# Patient Record
Sex: Female | Born: 1990 | Race: Black or African American | Hispanic: No | Marital: Single | Smoking: Former smoker
Health system: Southern US, Community
[De-identification: ages and names within clinical notes are randomized; demographics above are authoritative.]

## PROBLEM LIST (undated history)

## (undated) DIAGNOSIS — G43909 Migraine, unspecified, not intractable, without status migrainosus: Secondary | ICD-10-CM

## (undated) DIAGNOSIS — E119 Type 2 diabetes mellitus without complications: Secondary | ICD-10-CM

## (undated) DIAGNOSIS — J45909 Unspecified asthma, uncomplicated: Secondary | ICD-10-CM

## (undated) DIAGNOSIS — E282 Polycystic ovarian syndrome: Secondary | ICD-10-CM

## (undated) DIAGNOSIS — F909 Attention-deficit hyperactivity disorder, unspecified type: Secondary | ICD-10-CM

## (undated) HISTORY — PX: ANKLE SURGERY: SHX546

---

## 2021-05-15 ENCOUNTER — Emergency Department (HOSPITAL_BASED_OUTPATIENT_CLINIC_OR_DEPARTMENT_OTHER)
Admission: EM | Admit: 2021-05-15 | Discharge: 2021-05-16 | Disposition: A | Discharge status: Home / Self Care | Payer: Self-pay | Attending: Emergency Medicine | Admitting: Emergency Medicine

## 2021-05-15 ENCOUNTER — Other Ambulatory Visit: Payer: Self-pay

## 2021-05-15 DIAGNOSIS — J069 Acute upper respiratory infection, unspecified: Secondary | ICD-10-CM | POA: Insufficient documentation

## 2021-05-15 DIAGNOSIS — R079 Chest pain, unspecified: Secondary | ICD-10-CM | POA: Insufficient documentation

## 2021-05-15 DIAGNOSIS — R42 Dizziness and giddiness: Secondary | ICD-10-CM | POA: Insufficient documentation

## 2021-05-15 DIAGNOSIS — R112 Nausea with vomiting, unspecified: Secondary | ICD-10-CM | POA: Insufficient documentation

## 2021-05-15 HISTORY — DX: Polycystic ovarian syndrome: E28.2

## 2021-05-15 HISTORY — DX: Unspecified asthma, uncomplicated: J45.909

## 2021-05-15 HISTORY — DX: Type 2 diabetes mellitus without complications: E11.9

## 2021-05-15 HISTORY — DX: Attention-deficit hyperactivity disorder, unspecified type: F90.9

## 2021-05-16 ENCOUNTER — Emergency Department (HOSPITAL_BASED_OUTPATIENT_CLINIC_OR_DEPARTMENT_OTHER): Payer: Self-pay

## 2021-05-16 ENCOUNTER — Encounter (HOSPITAL_BASED_OUTPATIENT_CLINIC_OR_DEPARTMENT_OTHER): Payer: Self-pay | Admitting: Emergency Medicine

## 2021-05-16 LAB — CBC WITH DIFFERENTIAL/PLATELET
Abs Immature Granulocytes: 0.04 10*3/uL (ref 0.00–0.07)
Basophils Absolute: 0 10*3/uL (ref 0.0–0.1)
Basophils Relative: 0 %
Eosinophils Absolute: 0.1 10*3/uL (ref 0.0–0.5)
Eosinophils Relative: 1 %
HCT: 37.9 % (ref 36.0–46.0)
Hemoglobin: 12.8 g/dL (ref 12.0–15.0)
Immature Granulocytes: 1 %
Lymphocytes Relative: 13 %
Lymphs Abs: 0.8 10*3/uL (ref 0.7–4.0)
MCH: 28.9 pg (ref 26.0–34.0)
MCHC: 33.8 g/dL (ref 30.0–36.0)
MCV: 85.6 fL (ref 80.0–100.0)
Monocytes Absolute: 0.5 10*3/uL (ref 0.1–1.0)
Monocytes Relative: 8 %
Neutro Abs: 5.2 10*3/uL (ref 1.7–7.7)
Neutrophils Relative %: 77 %
Platelets: 289 10*3/uL (ref 150–400)
RBC: 4.43 MIL/uL (ref 3.87–5.11)
RDW: 14.6 % (ref 11.5–15.5)
WBC: 6.7 10*3/uL (ref 4.0–10.5)
nRBC: 0 % (ref 0.0–0.2)

## 2021-05-16 LAB — CBG MONITORING, ED: Glucose-Capillary: 107 mg/dL — ABNORMAL HIGH (ref 70–99)

## 2021-05-16 LAB — COMPREHENSIVE METABOLIC PANEL
ALT: 15 U/L (ref 0–44)
AST: 19 U/L (ref 15–41)
Albumin: 3.1 g/dL — ABNORMAL LOW (ref 3.5–5.0)
Alkaline Phosphatase: 37 U/L — ABNORMAL LOW (ref 38–126)
Anion gap: 8 (ref 5–15)
BUN: 12 mg/dL (ref 6–20)
CO2: 22 mmol/L (ref 22–32)
Calcium: 8.4 mg/dL — ABNORMAL LOW (ref 8.9–10.3)
Chloride: 103 mmol/L (ref 98–111)
Creatinine, Ser: 0.71 mg/dL (ref 0.44–1.00)
GFR, Estimated: >60 mL/min (ref >60)
Glucose, Bld: 98 mg/dL (ref 70–99)
Potassium: 3.5 mmol/L (ref 3.5–5.1)
Sodium: 133 mmol/L — ABNORMAL LOW (ref 135–145)
Total Bilirubin: 0.9 mg/dL (ref 0.3–1.2)
Total Protein: 7 g/dL (ref 6.5–8.1)

## 2021-05-16 LAB — HCG, SERUM, QUALITATIVE: Preg, Serum: NEGATIVE

## 2021-05-16 LAB — LIPASE, BLOOD: Lipase: 21 U/L (ref 11–51)

## 2021-05-16 MED ORDER — ACETAMINOPHEN 500 MG PO TABS
1000.0000 mg | ORAL_TABLET | Freq: Once | ORAL | Status: AC
  Administered 2021-05-16: 1000 mg via ORAL
  Filled 2021-05-16: qty 2

## 2021-05-16 MED ORDER — ONDANSETRON HCL 4 MG/2ML IJ SOLN
4.0000 mg | Freq: Once | INTRAMUSCULAR | Status: AC
  Administered 2021-05-16: 4 mg via INTRAVENOUS
  Filled 2021-05-16: qty 2

## 2021-05-16 MED ORDER — SODIUM CHLORIDE 0.9 % IV BOLUS
1000.0000 mL | Freq: Once | INTRAVENOUS | Status: AC
  Administered 2021-05-16: 1000 mL via INTRAVENOUS

## 2021-05-16 MED ORDER — ONDANSETRON 4 MG PO TBDP
4.0000 mg | ORAL_TABLET | Freq: Once | ORAL | Status: AC
  Administered 2021-05-16: 4 mg via ORAL
  Filled 2021-05-16: qty 1

## 2021-05-16 MED ORDER — BENZONATATE 100 MG PO CAPS: 100.0000 mg | ORAL_CAPSULE | Freq: Three times a day (TID) | ORAL | 0 refills | Status: AC

## 2021-05-16 MED ORDER — ONDANSETRON 4 MG PO TBDP
ORAL_TABLET | ORAL | 0 refills | Status: AC
Start: 2021-05-16 — End: ?

## 2021-05-16 NOTE — Discharge Instructions (Addendum)
Take tylenol 2 pills 4 times a day and motrin 4 pills 3 times a day.  Drink plenty of fluids.  Return for worsening shortness of breath, headache, confusion. Follow up with your family doctor.   

## 2021-05-16 NOTE — ED Triage Notes (Addendum)
Pt c/o body aches, chest pain, chills, chest pain, dizziness and nausea with 1 episode of vomiting. Pt states blood sugar was low at home and had protein shake and fruit but vomited after eating ?

## 2021-05-16 NOTE — ED Provider Notes (Signed)
?[place] EMERGENCY DEPARTMENT ?Provider Note ? ? ?CSN: [id number] ?Arrival date & time: 05/15/21  2358 ? ?  ? ?History ? ?Chief Complaint  ?Patient presents with  ? Chest Pain  ? ? ?Kristin Oneill is a 32 y.o. female. ? ?31 yo F with a chief complaints of fevers chills myalgias nausea vomiting feeling lightheaded cough and chest pain.  Been going on for about 48 hours.  She recently would been diagnosed with sinusitis about a week ago she felt she had gotten better and then got worse.  Her daughter is also been sick. ? ? ?Chest Pain ? ?  ? ?Home Medications ?Prior to Admission medications   ?Medication Sig Start Date End Date Taking? Authorizing Provider  ?benzonatate (TESSALON) 100 MG capsule Take 1 capsule (100 mg total) by mouth every 8 (eight) hours. 05/16/21  Yes Deno Etienne, DO  ?levocetirizine (XYZAL) 5 MG tablet Take 1 tablet by mouth every evening. 09/30/20  Yes [provider]  ?norethindrone-ethinyl estradiol-FE (LOESTRIN FE) 1-20 MG-MCG tablet Take 1 tablet by mouth daily. 09/30/20  Yes [provider]  ?ondansetron (ZOFRAN) 4 MG tablet TAKE 1 TABLET BY MOUTH EVERY 8 HOURS AS NEEDED FOR NAUSEA AND VOMITING FOR UP TO 18 DAYS 04/22/21  Yes [provider]  ?ondansetron (ZOFRAN-ODT) 4 MG disintegrating tablet 4mg  ODT q4 hours prn nausea/vomit 05/16/21  Yes Deno Etienne, DO  ?phentermine (ADIPEX-P) 37.5 MG tablet Take by mouth. 05/07/21 06/09/21 Yes [provider]  ?   ? ?Allergies    ?Abilify [aripiprazole] and Iodinated contrast media   ? ?Review of Systems   ?Review of Systems  ?Cardiovascular:  Positive for chest pain.  ? ?Physical Exam ?Updated Vital Signs ?BP (!) 147/93   Pulse (!) 122   Temp 98.4 ?F (36.9 ?C) (Oral)   Resp 18   Ht 5\' 8"  (1.727 m)   Wt 117.9 kg   SpO2 96%   BMI 39.53 kg/m?  ?Physical Exam ?Vitals and nursing note reviewed.  ?Constitutional:   ?   General: She is not in acute distress. ?   Appearance: She is well-developed. She is not  diaphoretic.  ?HENT:  ?   Head: Normocephalic and atraumatic.  ?Eyes:  ?   Pupils: Pupils are equal, round, and reactive to light.  ?Cardiovascular:  ?   Rate and Rhythm: Normal rate and regular rhythm.  ?   Heart sounds: No murmur heard. ?  No friction rub. No gallop.  ?Pulmonary:  ?   Effort: Pulmonary effort is normal.  ?   Breath sounds: No wheezing or rales.  ?Abdominal:  ?   General: There is abdominal bruit. There is no distension.  ?   Palpations: Abdomen is soft.  ?   Tenderness: There is no abdominal tenderness.  ?   Comments: Mild epigastric pain ?  ?Musculoskeletal:     ?   General: No tenderness.  ?   Cervical back: Normal range of motion and neck supple.  ?Skin: ?   General: Skin is warm and dry.  ?Neurological:  ?   Mental Status: She is alert and oriented to person, place, and time.  ?Psychiatric:     ?   Behavior: Behavior normal.  ? ? ?ED Results / Procedures / Treatments   ?Labs ?(all labs ordered are listed, but only abnormal results are displayed) ?Labs Reviewed  ?COMPREHENSIVE METABOLIC PANEL - Abnormal; Notable for the following components:  ?    Result Value  ? Sodium 133 (*)   ?  Calcium 8.4 (*)   ? Albumin 3.1 (*)   ? Alkaline Phosphatase 37 (*)   ? All other components within normal limits  ?CBG MONITORING, ED - Abnormal; Notable for the following components:  ? Glucose-Capillary 107 (*)   ? All other components within normal limits  ?LIPASE, BLOOD  ?HCG, SERUM, QUALITATIVE  ?CBC WITH DIFFERENTIAL/PLATELET  ?CBC WITH DIFFERENTIAL/PLATELET  ? ? ?EKG ?EKG Interpretation ? ?Date/Time:  Friday May 16 2021 00:17:15 EDT ?Ventricular Rate:  111 ?PR Interval:  143 ?QRS Duration: 87 ?QT Interval:  304 ?QTC Calculation: 413 ?R Axis:   73 ?Text Interpretation: Sinus tachycardia No old tracing to compare Confirmed by Deno Etienne [phone]) on 05/16/2021 12:37:34 AM ? ?Radiology ?DG Chest Port 1 View ? ?Result Date: 05/16/2021 ?CLINICAL DATA:  Cough and chest pain EXAM: PORTABLE CHEST 1 VIEW COMPARISON:   None. FINDINGS: The heart size and mediastinal contours are within normal limits. Both lungs are clear. The visualized skeletal structures are unremarkable. IMPRESSION: No active disease. Electronically Signed   By: Donavan Foil M.D.   On: 05/16/2021 00:37   ? ?Procedures ?Procedures  ? ? ?Medications Ordered in ED ?Medications  ?sodium chloride 0.9 % bolus 1,000 mL (1,000 mLs Intravenous New Bag/Given 05/16/21 0056)  ?ondansetron [facility]) injection 4 mg (4 mg Intravenous Given 05/16/21 0056)  ?acetaminophen (TYLENOL) tablet 1,000 mg (1,000 mg Oral Given 05/16/21 0056)  ? ? ?ED Course/ Medical Decision Making/ A&P ?  ?                        ?Medical Decision Making ?Amount and/or Complexity of Data Reviewed ?Labs: ordered. ?Radiology: ordered. ? ?Risk ?OTC drugs. ?Prescription drug management. ? ? ?31 yo F with a chief complaint of what sounds like a viral syndrome cough congestion nausea vomiting going on since yesterday.  Her daughter had had a similar illness.  She is a bit tachycardic here.  Reportedly was hypoglycemic at home.  We will give a bolus of IV fluids check blood work treat nausea.  Reassess. ? ?Patient's tachycardia has resolved.  No significant electrolyte abnormality no significant anemia.  Chest x-ray independently interpreted by me without focal infiltrate.  Will treat as a viral syndrome.  PCP follow-up. ? ?1:43 AM:  I have discussed the diagnosis/risks/treatment options with the patient.  Evaluation and diagnostic testing in the emergency department does not suggest an emergent condition requiring admission or immediate intervention beyond what has been performed at this time.  They will follow up with  PCP. We also discussed returning to the ED immediately if new or worsening sx occur. We discussed the sx which are most concerning (e.g., sudden worsening pain, fever, inability to tolerate by mouth) that necessitate immediate return. Medications administered to the patient during their visit and  any new prescriptions provided to the patient are listed below. ? ?Medications given during this visit ?Medications  ?sodium chloride 0.9 % bolus 1,000 mL (1,000 mLs Intravenous New Bag/Given 05/16/21 0056)  ?ondansetron [facility]) injection 4 mg (4 mg Intravenous Given 05/16/21 0056)  ?acetaminophen (TYLENOL) tablet 1,000 mg (1,000 mg Oral Given 05/16/21 0056)  ? ? ? ?The patient appears reasonably screen and/or stabilized for discharge and I doubt any other medical condition or other [facility] requiring further screening, evaluation, or treatment in the ED at this time prior to discharge.  ? ? ? ? ? ? ? ? ?Final Clinical Impression(s) / ED Diagnoses ?Final diagnoses:  ?Viral URI with cough  ? ? ?  Rx / DC Orders ?ED Discharge Orders   ? ?      Ordered  ?  ondansetron (ZOFRAN-ODT) 4 MG disintegrating tablet       ? 05/16/21 0137  ?  benzonatate (TESSALON) 100 MG capsule  Every 8 hours       ? 05/16/21 0137  ? ?  ?  ? ?  ? ? ?  ?Deno Etienne, DO ?05/16/21 0143 ? ?

## 2021-09-30 ENCOUNTER — Emergency Department (HOSPITAL_BASED_OUTPATIENT_CLINIC_OR_DEPARTMENT_OTHER)
Admission: EM | Admit: 2021-09-30 | Discharge: 2021-09-30 | Disposition: A | Discharge status: Home / Self Care | Payer: Medicaid Other | Attending: Emergency Medicine | Admitting: Emergency Medicine

## 2021-09-30 ENCOUNTER — Other Ambulatory Visit: Payer: Self-pay

## 2021-09-30 ENCOUNTER — Encounter (HOSPITAL_BASED_OUTPATIENT_CLINIC_OR_DEPARTMENT_OTHER): Payer: Self-pay

## 2021-09-30 ENCOUNTER — Emergency Department (HOSPITAL_BASED_OUTPATIENT_CLINIC_OR_DEPARTMENT_OTHER): Payer: Medicaid Other

## 2021-09-30 DIAGNOSIS — W01198A Fall on same level from slipping, tripping and stumbling with subsequent striking against other object, initial encounter: Secondary | ICD-10-CM | POA: Insufficient documentation

## 2021-09-30 DIAGNOSIS — J45909 Unspecified asthma, uncomplicated: Secondary | ICD-10-CM | POA: Insufficient documentation

## 2021-09-30 DIAGNOSIS — E119 Type 2 diabetes mellitus without complications: Secondary | ICD-10-CM | POA: Insufficient documentation

## 2021-09-30 DIAGNOSIS — F17210 Nicotine dependence, cigarettes, uncomplicated: Secondary | ICD-10-CM | POA: Insufficient documentation

## 2021-09-30 DIAGNOSIS — S0990XA Unspecified injury of head, initial encounter: Secondary | ICD-10-CM

## 2021-09-30 DIAGNOSIS — S0083XA Contusion of other part of head, initial encounter: Secondary | ICD-10-CM | POA: Insufficient documentation

## 2021-09-30 HISTORY — DX: Migraine, unspecified, not intractable, without status migrainosus: G43.909
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
## 2021-09-30 NOTE — ED Triage Notes (Signed)
Pt reports at around 12pm today she was in a closet and a book came down and hit her in the middle forehead. Pt states she laid down after taking migraine meds and took a nap; noted ringing in her ears as well. Pt states she woke up and now feels weird; endorses dizziness and feels off-balance. Pt states her vision goes in circles.

## 2021-12-15 ENCOUNTER — Emergency Department (HOSPITAL_BASED_OUTPATIENT_CLINIC_OR_DEPARTMENT_OTHER)
Admission: EM | Admit: 2021-12-15 | Discharge: 2021-12-15 | Disposition: A | Discharge status: Home / Self Care | Payer: BC Managed Care – PPO | Attending: Emergency Medicine | Admitting: Emergency Medicine

## 2021-12-15 ENCOUNTER — Encounter (HOSPITAL_BASED_OUTPATIENT_CLINIC_OR_DEPARTMENT_OTHER): Payer: Self-pay

## 2021-12-15 ENCOUNTER — Other Ambulatory Visit: Payer: Self-pay

## 2021-12-15 ENCOUNTER — Other Ambulatory Visit (HOSPITAL_BASED_OUTPATIENT_CLINIC_OR_DEPARTMENT_OTHER): Payer: Self-pay

## 2021-12-15 DIAGNOSIS — I10 Essential (primary) hypertension: Secondary | ICD-10-CM | POA: Insufficient documentation

## 2021-12-15 DIAGNOSIS — E119 Type 2 diabetes mellitus without complications: Secondary | ICD-10-CM | POA: Diagnosis not present

## 2021-12-15 DIAGNOSIS — N3000 Acute cystitis without hematuria: Secondary | ICD-10-CM | POA: Diagnosis not present

## 2021-12-15 DIAGNOSIS — B3731 Acute candidiasis of vulva and vagina: Secondary | ICD-10-CM | POA: Diagnosis not present

## 2021-12-15 DIAGNOSIS — N898 Other specified noninflammatory disorders of vagina: Secondary | ICD-10-CM

## 2021-12-15 DIAGNOSIS — R300 Dysuria: Secondary | ICD-10-CM | POA: Diagnosis present

## 2021-12-15 LAB — URINALYSIS, ROUTINE W REFLEX MICROSCOPIC
Bilirubin Urine: NEGATIVE
Glucose, UA: NEGATIVE mg/dL
Hgb urine dipstick: NEGATIVE
Ketones, ur: NEGATIVE mg/dL
Nitrite: NEGATIVE
Protein, ur: NEGATIVE mg/dL
Specific Gravity, Urine: 1.024 (ref 1.005–1.030)
WBC, UA: >50 WBC/hpf — ABNORMAL HIGH (ref 0–5)
pH: 6.5 (ref 5.0–8.0)

## 2021-12-15 LAB — WET PREP, GENITAL
Clue Cells Wet Prep HPF POC: NONE SEEN
Sperm: NONE SEEN
Trich, Wet Prep: NONE SEEN
WBC, Wet Prep HPF POC: <10 (ref <10)

## 2021-12-15 LAB — PREGNANCY, URINE: Preg Test, Ur: NEGATIVE

## 2021-12-15 MED ORDER — FLUCONAZOLE 150 MG PO TABS
150.0000 mg | ORAL_TABLET | Freq: Once | ORAL | Status: AC
  Administered 2021-12-15: 150 mg via ORAL
  Filled 2021-12-15: qty 1

## 2021-12-15 MED ORDER — CEPHALEXIN 500 MG PO CAPS
500.0000 mg | ORAL_CAPSULE | Freq: Four times a day (QID) | ORAL | 0 refills | Status: AC
  Filled 2021-12-15: qty 28, 7d supply, fill #0

## 2021-12-15 NOTE — Discharge Instructions (Signed)
Diflucan given to you here for the vaginal yeast infection.  Would recommend avoiding intercourse until feeling better.  If symptoms persist follow-up with your doctor.  Urinalysis questionable urinary tract infection.  We will go and treat you as possible urinary tract infection take the antibiotic Keflex as directed for the next 7 days.  Cultures for STD were pending will be notified if they are positive.

## 2021-12-15 NOTE — ED Triage Notes (Signed)
Pt c/o dysuria/ vaginal pain onset this AM. States she had sex w fiance last night, unsure if pain began then, but states she feels "tender" this morning.

## 2021-12-15 NOTE — ED Notes (Signed)
Discharge instructions, follow up care, and prescription reviewed and explained, pt verbalized understanding. Pt caox4 and ambulatory on d/c.  

## 2021-12-15 NOTE — ED Provider Notes (Addendum)
[facility] EMERGENCY DEPT Provider Note   CSN: [id number] Arrival date & time: 12/15/21  3335     History  Chief Complaint  Patient presents with   Dysuria    Kristin Oneill is a 31 y.o. female.  Patient with a complaint of vaginal pain and burning with urination.  Patient is unable to remember intercourse last night.  But definitely tenderness morning.  Not interested in pressing any charges.  Past medical history significant for diabetes without complications polycystic ovarian syndrome asthma ADHD migraines.  Occasional cigarette smoker.  Patient denies any vaginal bleeding.       Home Medications Prior to Admission medications   Medication Sig Start Date End Date Taking? Authorizing Provider  benzonatate (TESSALON) 100 MG capsule Take 1 capsule (100 mg total) by mouth every 8 (eight) hours. 05/16/21   Melene Plan, DO  levocetirizine (XYZAL) 5 MG tablet Take 1 tablet by mouth every evening. 09/30/20   [provider]  norethindrone-ethinyl estradiol-FE (LOESTRIN FE) 1-20 MG-MCG tablet Take 1 tablet by mouth daily. 09/30/20   [provider]  ondansetron (ZOFRAN-ODT) 4 MG disintegrating tablet 4mg  ODT q4 hours prn nausea/vomit 05/16/21   05/18/21, DO  phentermine (ADIPEX-P) 37.5 MG tablet Take by mouth. 05/07/21 06/09/21  [provider]      Allergies    Abilify [aripiprazole] and Iodinated contrast media    Review of Systems   Review of Systems  Constitutional:  Negative for chills and fever.  HENT:  Negative for ear pain and sore throat.   Eyes:  Negative for pain and visual disturbance.  Respiratory:  Negative for cough and shortness of breath.   Cardiovascular:  Negative for chest pain and palpitations.  Gastrointestinal:  Negative for abdominal pain and vomiting.  Genitourinary:  Positive for dysuria, pelvic pain and vaginal pain. Negative for hematuria, vaginal bleeding and vaginal discharge.  Musculoskeletal:  Negative for  arthralgias and back pain.  Skin:  Negative for color change and rash.  Neurological:  Negative for seizures and syncope.  All other systems reviewed and are negative.   Physical Exam Updated Vital Signs BP (!) 139/90   Pulse 82   Temp 97.9 F (36.6 C) (Oral)   Resp 18   Ht 1.727 m (5\' 8" )   Wt 113.4 kg   SpO2 100%   BMI 38.01 kg/m  Physical Exam Vitals and nursing note reviewed.  Constitutional:      General: She is not in acute distress.    Appearance: Normal appearance. She is well-developed. She is not ill-appearing.  HENT:     Head: Normocephalic and atraumatic.  Eyes:     Conjunctiva/sclera: Conjunctivae normal.  Cardiovascular:     Rate and Rhythm: Normal rate and regular rhythm.     Heart sounds: No murmur heard. Pulmonary:     Effort: Pulmonary effort is normal. No respiratory distress.     Breath sounds: Normal breath sounds.  Abdominal:     Palpations: Abdomen is soft.     Tenderness: There is no abdominal tenderness.  Genitourinary:    Comments: External genitalia normal.  Will bit of an abrasion at the posterior aspect of the vaginal opening.  A little bit of vaginal wall irritation but no bleeding no lacerations no cervical motion tenderness no uterine tenderness no adnexal tenderness.  Slight white discharge.  Generalized tenderness around the vaginal introitus.  No skin lesions.  No vesicles. Musculoskeletal:        General: No swelling.  Cervical back: Neck supple.  Skin:    General: Skin is warm and dry.     Capillary Refill: Capillary refill takes less than 2 seconds.  Neurological:     Mental Status: She is alert.  Psychiatric:        Mood and Affect: Mood normal.     ED Results / Procedures / Treatments   Labs (all labs ordered are listed, but only abnormal results are displayed) Labs Reviewed  WET PREP, GENITAL - Abnormal; Notable for the following components:      Result Value   Yeast Wet Prep HPF POC PRESENT (*)    All other  components within normal limits  URINALYSIS, ROUTINE W REFLEX MICROSCOPIC - Abnormal; Notable for the following components:   Leukocytes,Ua LARGE (*)    WBC, UA >50 (*)    Bacteria, UA RARE (*)    All other components within normal limits  URINE CULTURE  PREGNANCY, URINE  GC/CHLAMYDIA PROBE AMP ([place]) NOT AT [facility]    EKG None  Radiology No results found.  Procedures Procedures    Medications Ordered in ED Medications - No data to display  ED Course/ Medical Decision Making/ A&P                           Medical Decision Making Amount and/or Complexity of Data Reviewed Labs: ordered.  Pregnancy test test is negative.  Urinalysis with some RBCs 11-20 white blood cell count greater than 50.  Patient is worried about some injury.  Will go ahead and do pelvic.  Will get wet prep since there is a large amount of white blood cells.  And will get chlamydia and gonorrhea checked.  Patient not worried about an STD.  If pelvic leaves still some questions that hand may get pelvic ultrasound.  Pelvic exam seems to social general vaginal introitus irritation.  Little bit of an abrasion but no open wounds.  Wet prep positive for yeast.  Urinalysis questionable urinary tract infection urine culture sent.  Will treat here with Diflucan.  And put her on a course of Keflex.  Pregnancy test was negative.    Final Clinical Impression(s) / ED Diagnoses Final diagnoses:  Vaginal irritation  Acute cystitis without hematuria  Vaginal yeast infection    Rx / DC Orders ED Discharge Orders     None         Vanetta Mulders, MD 12/15/21 1032    Vanetta Mulders, MD 12/15/21 1156

## 2021-12-16 LAB — URINE CULTURE

## 2021-12-16 LAB — GC/CHLAMYDIA PROBE AMP (~~LOC~~) NOT AT ARMC
Chlamydia: NEGATIVE
Comment: NEGATIVE
Comment: NORMAL
Neisseria Gonorrhea: NEGATIVE

## 2022-01-14 ENCOUNTER — Emergency Department (HOSPITAL_BASED_OUTPATIENT_CLINIC_OR_DEPARTMENT_OTHER)
Admission: EM | Admit: 2022-01-14 | Discharge: 2022-01-14 | Disposition: A | Discharge status: Home / Self Care | Payer: BC Managed Care – PPO | Attending: Emergency Medicine | Admitting: Emergency Medicine

## 2022-01-14 ENCOUNTER — Encounter (HOSPITAL_BASED_OUTPATIENT_CLINIC_OR_DEPARTMENT_OTHER): Payer: Self-pay

## 2022-01-14 ENCOUNTER — Emergency Department (HOSPITAL_BASED_OUTPATIENT_CLINIC_OR_DEPARTMENT_OTHER): Payer: BC Managed Care – PPO | Admitting: Radiology

## 2022-01-14 ENCOUNTER — Other Ambulatory Visit: Payer: Self-pay

## 2022-01-14 DIAGNOSIS — E119 Type 2 diabetes mellitus without complications: Secondary | ICD-10-CM | POA: Insufficient documentation

## 2022-01-14 DIAGNOSIS — M542 Cervicalgia: Secondary | ICD-10-CM | POA: Insufficient documentation

## 2022-01-14 DIAGNOSIS — J45909 Unspecified asthma, uncomplicated: Secondary | ICD-10-CM | POA: Diagnosis not present

## 2022-01-14 DIAGNOSIS — M545 Low back pain, unspecified: Secondary | ICD-10-CM | POA: Diagnosis not present

## 2022-01-14 LAB — PREGNANCY, URINE: Preg Test, Ur: NEGATIVE

## 2022-01-14 MED ORDER — NAPROXEN 500 MG PO TABS
500.0000 mg | ORAL_TABLET | Freq: Two times a day (BID) | ORAL | 0 refills | Status: AC
  Filled 2022-01-14: qty 30, 15d supply, fill #0

## 2022-01-14 MED ORDER — CYCLOBENZAPRINE HCL 10 MG PO TABS
10.0000 mg | ORAL_TABLET | Freq: Two times a day (BID) | ORAL | 0 refills | Status: AC | PRN
  Filled 2022-01-14: qty 20, 10d supply, fill #0

## 2022-01-14 MED ORDER — IBUPROFEN 800 MG PO TABS
800.0000 mg | ORAL_TABLET | Freq: Once | ORAL | Status: AC
  Administered 2022-01-14: 800 mg via ORAL
  Filled 2022-01-14: qty 1

## 2022-01-14 NOTE — Discharge Instructions (Addendum)
Your workups were reassuring today.  Please take tylenol/ibuprofen/naproxen or Flexeril as needed for pain. I recommend close follow-up with PCP for reevaluation.  Please do not hesitate to return to emergency department if worrisome signs symptoms we discussed become apparent.

## 2022-01-14 NOTE — ED Notes (Signed)
Pt awake and alert sitting up in bed - GCS 15.  Speech clear.  C-Collar remains securely in place.  Pt reports ongoing posterior neck, lower back and L shoulder pain; rated 6/10.  RR even and unlabored on RA
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
## 2022-01-14 NOTE — ED Notes (Signed)
Pt to CT dept via w/c; per PA-C Jeanelle Malling it is ok for Radiology to remove c-collar for c-spine xray

## 2022-01-14 NOTE — ED Notes (Signed)
C-Spine cleared by provider; upon this nurse returning to room C-Collar has been removed.  Pt agreeable with d/c plan as discussed by provider- this nurse has verbally reinforced d/c instructions and provided pt with written copy.  Pt acknowledges verbal understanding and denies any addl questions, concerns, needs- pt ambulatory at d/c independently with steady gait; no acute changes/distress.
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
## 2022-01-15 ENCOUNTER — Other Ambulatory Visit (HOSPITAL_BASED_OUTPATIENT_CLINIC_OR_DEPARTMENT_OTHER): Payer: Self-pay

## 2022-03-24 ENCOUNTER — Other Ambulatory Visit: Payer: Self-pay

## 2022-03-24 ENCOUNTER — Emergency Department (HOSPITAL_BASED_OUTPATIENT_CLINIC_OR_DEPARTMENT_OTHER)
Admission: EM | Admit: 2022-03-24 | Discharge: 2022-03-24 | Disposition: A | Discharge status: Home / Self Care | Payer: BC Managed Care – PPO | Attending: Emergency Medicine | Admitting: Emergency Medicine

## 2022-03-24 ENCOUNTER — Encounter (HOSPITAL_BASED_OUTPATIENT_CLINIC_OR_DEPARTMENT_OTHER): Payer: Self-pay | Admitting: Emergency Medicine

## 2022-03-24 DIAGNOSIS — I10 Essential (primary) hypertension: Secondary | ICD-10-CM | POA: Diagnosis present

## 2022-03-24 DIAGNOSIS — Z79899 Other long term (current) drug therapy: Secondary | ICD-10-CM | POA: Diagnosis not present

## 2022-03-24 DIAGNOSIS — R030 Elevated blood-pressure reading, without diagnosis of hypertension: Secondary | ICD-10-CM

## 2022-03-24 NOTE — ED Triage Notes (Signed)
Pt here for hypertension at psychiatrists office. Pt states she needed a new prescription for her ADHD medication and pcp suggested to get it through psychiatrist. Per psychiatrist BP was 159/110 and then 172/121, stated it was too high to prescribe medication. Pt denies symptoms, no hx of HTN. PT wanted to come get it checked to make sure nothing was wrong.

## 2022-03-24 NOTE — Discharge Instructions (Signed)
Evaluation for your blood pressure today was overall reassuring.  Your last check was 122/79 which is relatively normal.  Advised that you follow-up with your PCP for your Vyvanse refill.  If you have new shortness of breath, chest pain, headache, visual disturbance or abdominal pain or any other concerning symptom please return emergency department further evaluation.

## 2022-03-24 NOTE — ED Provider Notes (Signed)
[facility] Provider Note   CSN: [id number] Arrival date & time: 03/24/22  1055     History  Chief Complaint  Patient presents with   Hypertension   HPI Kristin Oneill is a 32 y.o. female presenting for hypertension.  States she was being seen by her her psychiatrist for routine visit and was found to have a blood pressure of 159/110 then on recheck it was 172/121.  Patient was plan to have a refill at this visit for her Vyvanse which she has been stable on for over 3 years now.  Psychiatrist was reluctant to refill her medication due to her elevated blood pressure.  Patient was advised by her psychiatrist to come here for further evaluation.  Patient denies headache, visual disturbance, chest pain, shortness of breath or trouble breathing, and abdominal pain.  Patient says she plans to go see her PCP back in [place] who will prescribe her Vyvanse.  States she has enough medication at this time and does not need refill.   Hypertension       Home Medications Prior to Admission medications   Medication Sig Start Date End Date Taking? Authorizing Provider  benzonatate (TESSALON) 100 MG capsule Take 1 capsule (100 mg total) by mouth every 8 (eight) hours. 05/16/21   Deno Etienne, DO  buPROPion (WELLBUTRIN XL) 300 MG 24 hr tablet Take 300 mg by mouth every morning. 02/11/22   [provider]  cephALEXin (KEFLEX) 500 MG capsule Take 1 capsule (500 mg total) by mouth 4 (four) times daily. 12/15/21   Fredia Sorrow, MD  cyclobenzaprine (FLEXERIL) 10 MG tablet Take 1 tablet (10 mg total) by mouth 2 (two) times daily as needed for muscle spasms. 01/14/22   Rex Kras, PA  desvenlafaxine (PRISTIQ) 25 MG 24 hr tablet Take 25 mg by mouth daily. 02/11/22   [provider]  Vevelyn Royals 0.12-0.015 MG/24HR vaginal ring See admin instructions. 02/16/22   [provider]  levocetirizine (XYZAL) 5 MG tablet Take 1 tablet by mouth every  evening. 09/30/20   [provider]  naproxen (NAPROSYN) 500 MG tablet Take 1 tablet (500 mg total) by mouth 2 (two) times daily. 01/14/22   Rex Kras, PA  norethindrone-ethinyl estradiol-FE (LOESTRIN FE) 1-20 MG-MCG tablet Take 1 tablet by mouth daily. 09/30/20   [provider]  ondansetron (ZOFRAN-ODT) 4 MG disintegrating tablet '4mg'$  ODT q4 hours prn nausea/vomit 05/16/21   Deno Etienne, DO  phentermine (ADIPEX-P) 37.5 MG tablet Take by mouth. 05/07/21 06/09/21  [provider]      Allergies    Abilify [aripiprazole] and Iodinated contrast media    Review of Systems   See HPI for pertinent postives  Physical Exam Updated Vital Signs BP 122/79   Pulse 87   Temp 98 F (36.7 C) (Oral)   Resp 18   SpO2 100%  Physical Exam Vitals and nursing note reviewed.  HENT:     Head: Normocephalic and atraumatic.     Mouth/Throat:     Mouth: Mucous membranes are moist.  Eyes:     General:        Right eye: No discharge.        Left eye: No discharge.     Conjunctiva/sclera: Conjunctivae normal.  Cardiovascular:     Rate and Rhythm: Normal rate and regular rhythm.     Pulses: Normal pulses.     Heart sounds: Normal heart sounds.  Pulmonary:     Effort: Pulmonary effort is  normal.     Breath sounds: Normal breath sounds.  Abdominal:     General: Abdomen is flat.     Palpations: Abdomen is soft.  Skin:    General: Skin is warm and dry.  Neurological:     General: No focal deficit present.  Psychiatric:        Mood and Affect: Mood normal.     ED Results / Procedures / Treatments   Labs (all labs ordered are listed, but only abnormal results are displayed) Labs Reviewed - No data to display  EKG None  Radiology No results found.  Procedures Procedures    Medications Ordered in ED Medications - No data to display  ED Course/ Medical Decision Making/ A&P                             Medical Decision Making  32 year old female who is  well-appearing and hemodynamically stable.  Exam unremarkable.  Blood pressure during my encounter with her was 122/79.  Patient denied any concerning symptoms for MI or hypertensive emergency.  Have low suspicion given her relatively normal blood pressure.  Advised her to follow-up with her PCP for discussion of be filling her Vyvanse.  Patient states she was planning to schedule an appointment in the next 2 to 3 days.  Discharged with stable vitals.  Discussed return precautions.        Final Clinical Impression(s) / ED Diagnoses Final diagnoses:  Elevated blood pressure reading    Rx / DC Orders ED Discharge Orders     None         Harriet Pho, PA-C 03/24/22 1147    Blanchie Dessert, MD 03/25/22 1312

## 2022-10-02 DIAGNOSIS — Z20822 Contact with and (suspected) exposure to covid-19: Secondary | ICD-10-CM | POA: Diagnosis not present

## 2022-10-02 DIAGNOSIS — J189 Pneumonia, unspecified organism: Secondary | ICD-10-CM | POA: Diagnosis not present

## 2022-10-02 DIAGNOSIS — R5383 Other fatigue: Secondary | ICD-10-CM | POA: Diagnosis not present

## 2022-10-02 DIAGNOSIS — J984 Other disorders of lung: Secondary | ICD-10-CM | POA: Diagnosis not present

## 2022-10-02 DIAGNOSIS — J45909 Unspecified asthma, uncomplicated: Secondary | ICD-10-CM | POA: Diagnosis not present

## 2022-10-02 DIAGNOSIS — R059 Cough, unspecified: Secondary | ICD-10-CM | POA: Diagnosis not present

## 2022-10-02 DIAGNOSIS — H9201 Otalgia, right ear: Secondary | ICD-10-CM | POA: Diagnosis not present

## 2022-10-02 DIAGNOSIS — B349 Viral infection, unspecified: Secondary | ICD-10-CM | POA: Diagnosis not present

## 2022-10-02 DIAGNOSIS — J168 Pneumonia due to other specified infectious organisms: Secondary | ICD-10-CM | POA: Diagnosis not present

## 2022-10-02 DIAGNOSIS — Z9189 Other specified personal risk factors, not elsewhere classified: Secondary | ICD-10-CM | POA: Diagnosis not present

## 2022-10-02 DIAGNOSIS — R509 Fever, unspecified: Secondary | ICD-10-CM | POA: Diagnosis not present

## 2022-11-02 DIAGNOSIS — J069 Acute upper respiratory infection, unspecified: Secondary | ICD-10-CM | POA: Diagnosis not present

## 2022-11-02 DIAGNOSIS — R509 Fever, unspecified: Secondary | ICD-10-CM | POA: Diagnosis not present

## 2022-11-02 DIAGNOSIS — Z87891 Personal history of nicotine dependence: Secondary | ICD-10-CM | POA: Diagnosis not present

## 2022-11-02 DIAGNOSIS — Z8701 Personal history of pneumonia (recurrent): Secondary | ICD-10-CM | POA: Diagnosis not present

## 2022-11-02 DIAGNOSIS — J029 Acute pharyngitis, unspecified: Secondary | ICD-10-CM | POA: Diagnosis not present

## 2022-11-02 DIAGNOSIS — Z20822 Contact with and (suspected) exposure to covid-19: Secondary | ICD-10-CM | POA: Diagnosis not present

## 2022-11-02 DIAGNOSIS — Z7951 Long term (current) use of inhaled steroids: Secondary | ICD-10-CM | POA: Diagnosis not present

## 2022-11-02 DIAGNOSIS — Z0000 Encounter for general adult medical examination without abnormal findings: Secondary | ICD-10-CM | POA: Diagnosis not present

## 2022-11-02 DIAGNOSIS — B9789 Other viral agents as the cause of diseases classified elsewhere: Secondary | ICD-10-CM | POA: Diagnosis not present

## 2022-11-02 DIAGNOSIS — R058 Other specified cough: Secondary | ICD-10-CM | POA: Diagnosis not present

## 2022-11-02 DIAGNOSIS — J45909 Unspecified asthma, uncomplicated: Secondary | ICD-10-CM | POA: Diagnosis not present

## 2022-11-26 DIAGNOSIS — L689 Hypertrichosis, unspecified: Secondary | ICD-10-CM | POA: Diagnosis not present

## 2022-11-26 DIAGNOSIS — Z3009 Encounter for other general counseling and advice on contraception: Secondary | ICD-10-CM | POA: Diagnosis not present

## 2022-11-26 DIAGNOSIS — Z113 Encounter for screening for infections with a predominantly sexual mode of transmission: Secondary | ICD-10-CM | POA: Diagnosis not present

## 2022-12-03 DIAGNOSIS — R7989 Other specified abnormal findings of blood chemistry: Secondary | ICD-10-CM | POA: Diagnosis not present

## 2023-02-16 DIAGNOSIS — Z0000 Encounter for general adult medical examination without abnormal findings: Secondary | ICD-10-CM | POA: Diagnosis not present

## 2023-09-24 IMAGING — DX DG CHEST 1V PORT
1 series · 1 of 1 positions shown · non-contrast
Comparison: None.

CLINICAL DATA: Cough and chest pain

EXAM:
PORTABLE CHEST 1 VIEW

[chest ap]
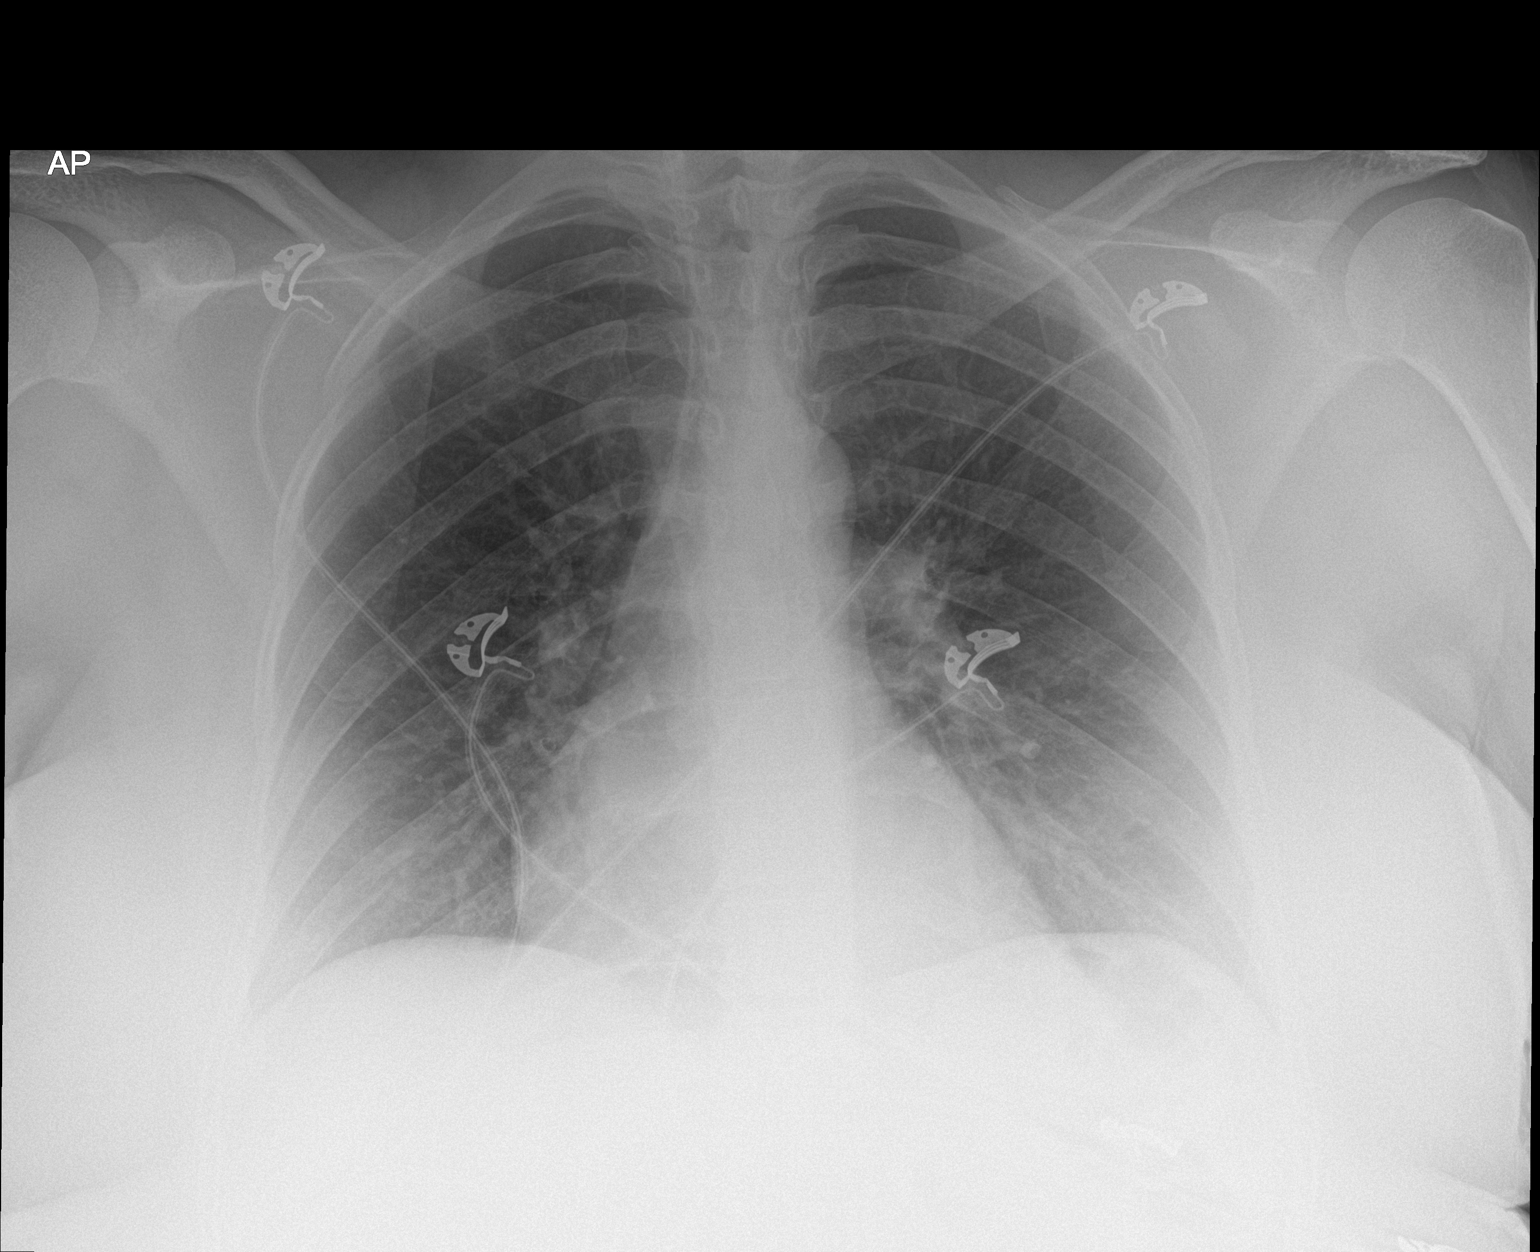

[1 of 1 positions shown; findings below may reference images not displayed]

FINDINGS: The heart size and mediastinal contours are within normal limits.
Both lungs are clear. The visualized skeletal structures are
unremarkable.
IMPRESSION: No active disease.

## 2023-11-29 DIAGNOSIS — F909 Attention-deficit hyperactivity disorder, unspecified type: Secondary | ICD-10-CM | POA: Diagnosis not present

## 2023-11-29 DIAGNOSIS — F4322 Adjustment disorder with anxiety: Secondary | ICD-10-CM | POA: Diagnosis not present

## 2023-12-14 DIAGNOSIS — D500 Iron deficiency anemia secondary to blood loss (chronic): Secondary | ICD-10-CM | POA: Diagnosis not present

## 2023-12-14 DIAGNOSIS — K921 Melena: Secondary | ICD-10-CM | POA: Diagnosis not present

## 2023-12-14 DIAGNOSIS — D62 Acute posthemorrhagic anemia: Secondary | ICD-10-CM | POA: Diagnosis not present

## 2023-12-20 DIAGNOSIS — F4322 Adjustment disorder with anxiety: Secondary | ICD-10-CM | POA: Diagnosis not present

## 2023-12-20 DIAGNOSIS — F909 Attention-deficit hyperactivity disorder, unspecified type: Secondary | ICD-10-CM | POA: Diagnosis not present

## 2023-12-20 DIAGNOSIS — Z630 Problems in relationship with spouse or partner: Secondary | ICD-10-CM | POA: Diagnosis not present

## 2023-12-23 DIAGNOSIS — N898 Other specified noninflammatory disorders of vagina: Secondary | ICD-10-CM | POA: Diagnosis not present

## 2023-12-23 DIAGNOSIS — Z124 Encounter for screening for malignant neoplasm of cervix: Secondary | ICD-10-CM | POA: Diagnosis not present

## 2023-12-23 DIAGNOSIS — Z01419 Encounter for gynecological examination (general) (routine) without abnormal findings: Secondary | ICD-10-CM | POA: Diagnosis not present

## 2023-12-23 DIAGNOSIS — Z113 Encounter for screening for infections with a predominantly sexual mode of transmission: Secondary | ICD-10-CM | POA: Diagnosis not present
